# Patient Record
Sex: Male | Born: 1975 | Hispanic: Yes | Marital: Single | Smoking: Current every day smoker
Health system: Southern US, Community
[De-identification: ages and names within clinical notes are randomized; demographics above are authoritative.]

## PROBLEM LIST (undated history)

## (undated) HISTORY — PX: TONSILLECTOMY: SUR1361

---

## 2018-04-30 ENCOUNTER — Encounter (HOSPITAL_COMMUNITY): Payer: Self-pay | Admitting: Emergency Medicine

## 2018-04-30 ENCOUNTER — Other Ambulatory Visit: Payer: Self-pay

## 2018-04-30 ENCOUNTER — Emergency Department (HOSPITAL_COMMUNITY)
Admission: EM | Admit: 2018-04-30 | Discharge: 2018-04-30 | Disposition: A | Discharge status: Home / Self Care | Payer: Self-pay | Attending: Emergency Medicine | Admitting: Emergency Medicine

## 2018-04-30 DIAGNOSIS — R109 Unspecified abdominal pain: Secondary | ICD-10-CM | POA: Insufficient documentation

## 2018-04-30 DIAGNOSIS — R112 Nausea with vomiting, unspecified: Secondary | ICD-10-CM | POA: Insufficient documentation

## 2018-04-30 DIAGNOSIS — F17200 Nicotine dependence, unspecified, uncomplicated: Secondary | ICD-10-CM | POA: Insufficient documentation

## 2018-04-30 DIAGNOSIS — R197 Diarrhea, unspecified: Secondary | ICD-10-CM | POA: Insufficient documentation

## 2018-04-30 LAB — CBC
HCT: 47.3 % (ref 39.0–52.0)
HEMOGLOBIN: 15.8 g/dL (ref 13.0–17.0)
MCH: 27.6 pg (ref 26.0–34.0)
MCHC: 33.4 g/dL (ref 30.0–36.0)
MCV: 82.5 fL (ref 78.0–100.0)
Platelets: 390 10*3/uL (ref 150–400)
RBC: 5.73 MIL/uL (ref 4.22–5.81)
RDW: 12.9 % (ref 11.5–15.5)
WBC: 8.1 10*3/uL (ref 4.0–10.5)

## 2018-04-30 LAB — COMPREHENSIVE METABOLIC PANEL
ALBUMIN: 4.3 g/dL (ref 3.5–5.0)
ALK PHOS: 57 U/L (ref 38–126)
ALT: 26 U/L (ref 0–44)
AST: 26 U/L (ref 15–41)
Anion gap: 14 (ref 5–15)
BUN: 7 mg/dL (ref 6–20)
CALCIUM: 9.6 mg/dL (ref 8.9–10.3)
CO2: 19 mmol/L — AB (ref 22–32)
CREATININE: 1.04 mg/dL (ref 0.61–1.24)
Chloride: 103 mmol/L (ref 98–111)
GFR calc Af Amer: >60 mL/min (ref >60)
GFR calc non Af Amer: >60 mL/min (ref >60)
GLUCOSE: 122 mg/dL — AB (ref 70–99)
Potassium: 3.7 mmol/L (ref 3.5–5.1)
SODIUM: 136 mmol/L (ref 135–145)
Total Bilirubin: 0.8 mg/dL (ref 0.3–1.2)
Total Protein: 7.9 g/dL (ref 6.5–8.1)

## 2018-04-30 LAB — URINALYSIS, ROUTINE W REFLEX MICROSCOPIC
Bacteria, UA: NONE SEEN
Bilirubin Urine: NEGATIVE
GLUCOSE, UA: NEGATIVE mg/dL
Hgb urine dipstick: NEGATIVE
Ketones, ur: 20 mg/dL — AB
Leukocytes, UA: NEGATIVE
Nitrite: NEGATIVE
PROTEIN: 100 mg/dL — AB
Specific Gravity, Urine: 1.015 (ref 1.005–1.030)
pH: 9 — ABNORMAL HIGH (ref 5.0–8.0)

## 2018-04-30 LAB — LIPASE, BLOOD: Lipase: 34 U/L (ref 11–51)

## 2018-04-30 MED ORDER — ONDANSETRON 4 MG PO TBDP: 4.0000 mg | ORAL_TABLET | Freq: Three times a day (TID) | ORAL | 0 refills | Status: AC | PRN

## 2018-04-30 MED ORDER — LOPERAMIDE HCL 2 MG PO CAPS: 2.0000 mg | ORAL_CAPSULE | Freq: Four times a day (QID) | ORAL | 0 refills | Status: DC | PRN

## 2018-04-30 MED ORDER — SODIUM CHLORIDE 0.9 % IV BOLUS
1000.0000 mL | Freq: Once | INTRAVENOUS | Status: AC
  Administered 2018-04-30: 1000 mL via INTRAVENOUS

## 2018-04-30 MED ORDER — LOPERAMIDE HCL 2 MG PO CAPS
4.0000 mg | ORAL_CAPSULE | Freq: Once | ORAL | Status: AC
  Administered 2018-04-30: 4 mg via ORAL
  Filled 2018-04-30: qty 2

## 2018-04-30 MED ORDER — PROMETHAZINE HCL 25 MG/ML IJ SOLN
12.5000 mg | Freq: Once | INTRAMUSCULAR | Status: AC
  Administered 2018-04-30: 12.5 mg via INTRAVENOUS
  Filled 2018-04-30: qty 1

## 2018-04-30 MED ORDER — DICYCLOMINE HCL 10 MG PO CAPS
10.0000 mg | ORAL_CAPSULE | Freq: Once | ORAL | Status: AC
  Administered 2018-04-30: 10 mg via ORAL
  Filled 2018-04-30: qty 1

## 2018-04-30 MED ORDER — ONDANSETRON HCL 4 MG/2ML IJ SOLN
4.0000 mg | Freq: Once | INTRAMUSCULAR | Status: AC
  Administered 2018-04-30: 4 mg via INTRAVENOUS
  Filled 2018-04-30: qty 2

## 2018-04-30 NOTE — Discharge Instructions (Addendum)
Make sure that you are staying hydrated.  Start with clear liquids and advance diet to small bland meals.

## 2018-04-30 NOTE — ED Notes (Signed)
Pt was given ginger ale for PO/fluid challenge---- tolerated well.

## 2018-04-30 NOTE — ED Triage Notes (Addendum)
Pt here via ems with c/o abdominal pain. Pt states many coworkers have been sick with GI symptoms this week. Pt recently moved here [place]. Pt reports many episodes of emesis and diarrhea. Most information gathered from EMS as pt is not participating in assessment.

## 2018-04-30 NOTE — ED Provider Notes (Signed)
[facility]-EMERGENCY DEPT Provider Note   CSN: [id number] Arrival date & time: 04/30/18  0047     History   Chief Complaint Chief Complaint  Patient presents with  . Abdominal Pain    HPI Ivan Diaz. is a 42 y.o. male.  HPI  This is a 42 year old male who presents with nausea, vomiting, abdominal pain, diarrhea.  Patient reports 1 to 2-day history of diffuse abdominal cramping.  Pain is nonradiating.  He rates his pain now 10 out of 10.  He has associated nonbilious, nonbloody emesis.  He reports multiple episodes of diarrhea.  He has noted some streaking of blood in his diarrhea but relates this to his known hemorrhoids.  He has not taken anything for his symptoms.  He reports multiple coworkers have reported similar symptoms.  He denies fevers.  Denies chest pain, shortness of breath.  History reviewed. No pertinent past medical history.  There are no active problems to display for this patient.   History reviewed. No pertinent surgical history.      Home Medications    Prior to Admission medications   Medication Sig Start Date End Date Taking? Authorizing Provider  loperamide (IMODIUM) 2 MG capsule Take 1 capsule (2 mg total) by mouth 4 (four) times daily as needed for diarrhea or loose stools. 04/30/18   Eliazer Hemphill, Mayer Masker, MD  ondansetron (ZOFRAN ODT) 4 MG disintegrating tablet Take 1 tablet (4 mg total) by mouth every 8 (eight) hours as needed for nausea or vomiting. 04/30/18   Junie Avilla, Mayer Masker, MD    Family History No family history on file.  Social History Social History   Tobacco Use  . Smoking status: Current Every Day Smoker  Substance Use Topics  . Alcohol use: Not Currently  . Drug use: Not Currently     Allergies   Patient has no known allergies.   Review of Systems Review of Systems  Constitutional: Negative for fever.  Respiratory: Negative for shortness of breath.   Cardiovascular: Negative for chest pain.    Gastrointestinal: Positive for abdominal pain, diarrhea, nausea and vomiting.  Genitourinary: Negative for dysuria.  All other systems reviewed and are negative.    Physical Exam Updated Vital Signs BP (!) 110/96   Pulse 68   Temp 98.3 F (36.8 C) (Oral)   Resp 18   SpO2 97%   Physical Exam  Constitutional: He is oriented to person, place, and time. He appears well-developed and well-nourished.  Uncomfortable appearing and moaning but nontoxic  HENT:  Head: Normocephalic and atraumatic.  Eyes: Pupils are equal, round, and reactive to light.  Neck: Neck supple.  Cardiovascular: Normal rate, regular rhythm and normal heart sounds.  No murmur heard. Pulmonary/Chest: Effort normal and breath sounds normal. No respiratory distress. He has no wheezes.  Abdominal: Soft. Bowel sounds are normal. There is generalized tenderness. There is no rebound and no guarding.  Musculoskeletal: He exhibits no edema.  Lymphadenopathy:    He has no cervical adenopathy.  Neurological: He is alert and oriented to person, place, and time.  Skin: Skin is warm and dry.  Psychiatric: He has a normal mood and affect.  Nursing note and vitals reviewed.    ED Treatments / Results  Labs (all labs ordered are listed, but only abnormal results are displayed) Labs Reviewed  COMPREHENSIVE METABOLIC PANEL - Abnormal; Notable for the following components:      Result Value   CO2 19 (*)    Glucose, Bld 122 (*)  All other components within normal limits  URINALYSIS, ROUTINE W REFLEX MICROSCOPIC - Abnormal; Notable for the following components:   pH 9.0 (*)    Ketones, ur 20 (*)    Protein, ur 100 (*)    All other components within normal limits  LIPASE, BLOOD  CBC    EKG None  Radiology No results found.  Procedures Procedures (including critical care time)  Medications Ordered in ED Medications  sodium chloride 0.9 % bolus 1,000 mL (0 mLs Intravenous Stopped 04/30/18 0249)  ondansetron  (ZOFRAN) injection 4 mg (4 mg Intravenous Given 04/30/18 0143)  dicyclomine (BENTYL) capsule 10 mg (10 mg Oral Given 04/30/18 0145)  loperamide (IMODIUM) capsule 4 mg (4 mg Oral Given 04/30/18 0145)  sodium chloride 0.9 % bolus 1,000 mL (0 mLs Intravenous Stopped 04/30/18 0420)  promethazine (PHENERGAN) injection 12.5 mg (12.5 mg Intravenous Given 04/30/18 0320)     Initial Impression / Assessment and Plan / ED Course  I have reviewed the triage vital signs and the nursing notes.  Pertinent labs & imaging results that were available during my care of the patient were reviewed by me and considered in my medical decision making (see chart for details).     Patient presents with nausea, vomiting, diarrhea, crampy abdominal pain.  He is overall nontoxic-appearing.  Vital signs are reassuring.  He has some mild diffuse tenderness but no signs of peritonitis or focal tenderness on exam.  Patient was given fluids, Zofran, Bentyl.  Lab work-up including lipase and LFTs are normal.  He does have 20 ketones in the urine suggestive of mild dehydration.  On multiple rechecks, he is tolerating fluids.  He does report some persistent crampy pain but no persistent vomiting or diarrhea.  At this time doubt acute emergent process.  Given nonfocal exam, would doubt appendicitis or gastroenteritis, or bowel obstruction at this time.  Will discharge home with supportive measures.  Recommend hydration and slow advancement of diet.  After history, exam, and medical workup I feel the patient has been appropriately medically screened and is safe for discharge home. Pertinent diagnoses were discussed with the patient. Patient was given return precautions.   Final Clinical Impressions(s) / ED Diagnoses   Final diagnoses:  Nausea vomiting and diarrhea    ED Discharge Orders         Ordered    loperamide (IMODIUM) 2 MG capsule  4 times daily PRN     04/30/18 0425    ondansetron (ZOFRAN ODT) 4 MG disintegrating tablet   Every 8 hours PRN     04/30/18 0425           Shon Baton, MD 04/30/18 [phone]

## 2018-05-01 ENCOUNTER — Encounter (HOSPITAL_COMMUNITY): Payer: Self-pay | Admitting: Emergency Medicine

## 2018-05-01 ENCOUNTER — Emergency Department (HOSPITAL_COMMUNITY)
Admission: EM | Admit: 2018-05-01 | Discharge: 2018-05-01 | Disposition: A | Discharge status: Home / Self Care | Payer: Self-pay | Attending: Emergency Medicine | Admitting: Emergency Medicine

## 2018-05-01 ENCOUNTER — Other Ambulatory Visit: Payer: Self-pay

## 2018-05-01 ENCOUNTER — Emergency Department (HOSPITAL_COMMUNITY): Payer: Self-pay

## 2018-05-01 DIAGNOSIS — R112 Nausea with vomiting, unspecified: Secondary | ICD-10-CM | POA: Insufficient documentation

## 2018-05-01 DIAGNOSIS — R197 Diarrhea, unspecified: Secondary | ICD-10-CM | POA: Insufficient documentation

## 2018-05-01 DIAGNOSIS — F17200 Nicotine dependence, unspecified, uncomplicated: Secondary | ICD-10-CM | POA: Insufficient documentation

## 2018-05-01 DIAGNOSIS — F12188 Cannabis abuse with other cannabis-induced disorder: Secondary | ICD-10-CM | POA: Insufficient documentation

## 2018-05-01 DIAGNOSIS — R509 Fever, unspecified: Secondary | ICD-10-CM | POA: Insufficient documentation

## 2018-05-01 LAB — COMPREHENSIVE METABOLIC PANEL
ALBUMIN: 4.7 g/dL (ref 3.5–5.0)
ALT: 24 U/L (ref 0–44)
ANION GAP: 11 (ref 5–15)
AST: 22 U/L (ref 15–41)
Alkaline Phosphatase: 61 U/L (ref 38–126)
BUN: 8 mg/dL (ref 6–20)
CHLORIDE: 104 mmol/L (ref 98–111)
CO2: 24 mmol/L (ref 22–32)
Calcium: 9.8 mg/dL (ref 8.9–10.3)
Creatinine, Ser: 1.16 mg/dL (ref 0.61–1.24)
GFR calc Af Amer: >60 mL/min (ref >60)
GFR calc non Af Amer: >60 mL/min (ref >60)
GLUCOSE: 110 mg/dL — AB (ref 70–99)
POTASSIUM: 4 mmol/L (ref 3.5–5.1)
SODIUM: 139 mmol/L (ref 135–145)
TOTAL PROTEIN: 8.7 g/dL — AB (ref 6.5–8.1)
Total Bilirubin: 0.8 mg/dL (ref 0.3–1.2)

## 2018-05-01 LAB — URINALYSIS, ROUTINE W REFLEX MICROSCOPIC
BILIRUBIN URINE: NEGATIVE
Glucose, UA: NEGATIVE mg/dL
Hgb urine dipstick: NEGATIVE
Ketones, ur: 20 mg/dL — AB
Leukocytes, UA: NEGATIVE
NITRITE: NEGATIVE
PH: 9 — AB (ref 5.0–8.0)
Protein, ur: NEGATIVE mg/dL
SPECIFIC GRAVITY, URINE: 1.012 (ref 1.005–1.030)

## 2018-05-01 LAB — CBC
HEMATOCRIT: 47.9 % (ref 39.0–52.0)
HEMOGLOBIN: 16 g/dL (ref 13.0–17.0)
MCH: 27.8 pg (ref 26.0–34.0)
MCHC: 33.4 g/dL (ref 30.0–36.0)
MCV: 83.2 fL (ref 78.0–100.0)
Platelets: 405 10*3/uL — ABNORMAL HIGH (ref 150–400)
RBC: 5.76 MIL/uL (ref 4.22–5.81)
RDW: 13 % (ref 11.5–15.5)
WBC: 8.2 10*3/uL (ref 4.0–10.5)

## 2018-05-01 LAB — RAPID URINE DRUG SCREEN, HOSP PERFORMED
Amphetamines: NOT DETECTED
BARBITURATES: NOT DETECTED
Benzodiazepines: NOT DETECTED
COCAINE: NOT DETECTED
Opiates: NOT DETECTED
TETRAHYDROCANNABINOL: POSITIVE — AB

## 2018-05-01 LAB — LIPASE, BLOOD: LIPASE: 29 U/L (ref 11–51)

## 2018-05-01 MED ORDER — IOPAMIDOL (ISOVUE-300) INJECTION 61%
100.0000 mL | Freq: Once | INTRAVENOUS | Status: AC | PRN
  Administered 2018-05-01: 100 mL via INTRAVENOUS

## 2018-05-01 MED ORDER — GI COCKTAIL ~~LOC~~
30.0000 mL | Freq: Once | ORAL | Status: AC
  Administered 2018-05-01: 30 mL via ORAL
  Filled 2018-05-01: qty 30

## 2018-05-01 MED ORDER — HALOPERIDOL LACTATE 5 MG/ML IJ SOLN
5.0000 mg | Freq: Once | INTRAMUSCULAR | Status: AC
  Administered 2018-05-01: 5 mg via INTRAVENOUS
  Filled 2018-05-01: qty 1

## 2018-05-01 MED ORDER — IOPAMIDOL (ISOVUE-300) INJECTION 61%
INTRAVENOUS | Status: AC
  Filled 2018-05-01: qty 100

## 2018-05-01 MED ORDER — OMEPRAZOLE 20 MG PO CPDR: 20.0000 mg | DELAYED_RELEASE_CAPSULE | Freq: Every day | ORAL | 0 refills | Status: AC

## 2018-05-01 MED ORDER — DICYCLOMINE HCL 10 MG/ML IM SOLN
20.0000 mg | Freq: Once | INTRAMUSCULAR | Status: AC
  Administered 2018-05-01: 20 mg via INTRAMUSCULAR
  Filled 2018-05-01: qty 2

## 2018-05-01 MED ORDER — SODIUM CHLORIDE 0.9 % IJ SOLN
INTRAMUSCULAR | Status: AC
  Administered 2018-05-01: 05:00:00
  Filled 2018-05-01: qty 50

## 2018-05-01 NOTE — ED Triage Notes (Signed)
Patient complaining of abdominal pain. He states he is having fever and vomiting. Patient state he has been sick for over a week.

## 2018-05-01 NOTE — ED Notes (Signed)
Urine specimen requested 

## 2018-05-01 NOTE — ED Provider Notes (Signed)
[facility]-EMERGENCY DEPT Provider Note   CSN: [id number] Arrival date & time: 05/01/18  0211     History   Chief Complaint Chief Complaint  Patient presents with  . Fever  . Emesis  . Abdominal Pain    HPI Ivan Francis. is a 42 y.o. male.  The history is provided by the patient.  Abdominal Pain   This is a new problem. The current episode started more than 1 week ago. The problem occurs constantly. The problem has been rapidly worsening. The pain is associated with an unknown factor. The pain is located in the generalized abdominal region. The quality of the pain is cramping. The pain is severe. Associated symptoms include diarrhea, nausea and vomiting. Pertinent negatives include fever and dysuria. Nothing aggravates the symptoms. Nothing relieves the symptoms. Past workup does not include surgery. His past medical history does not include PUD, ulcerative colitis or Crohn's disease.  Seen last night for same no diarrhea today but pain and vomiting returned post eating fries at work.    History reviewed. No pertinent past medical history.  There are no active problems to display for this patient.   History reviewed. No pertinent surgical history.      Home Medications    Prior to Admission medications   Medication Sig Start Date End Date Taking? Authorizing Provider  loperamide (IMODIUM) 2 MG capsule Take 1 capsule (2 mg total) by mouth 4 (four) times daily as needed for diarrhea or loose stools. 04/30/18   Horton, Mayer Masker, MD  ondansetron (ZOFRAN ODT) 4 MG disintegrating tablet Take 1 tablet (4 mg total) by mouth every 8 (eight) hours as needed for nausea or vomiting. 04/30/18   Horton, Mayer Masker, MD    Family History History reviewed. No pertinent family history.  Social History Social History   Tobacco Use  . Smoking status: Current Every Day Smoker  . Smokeless tobacco: Never Used  Substance Use Topics  . Alcohol use: Not Currently    . Drug use: Not Currently     Allergies   Patient has no known allergies.   Review of Systems Review of Systems  Constitutional: Negative for fever and unexpected weight change.  Respiratory: Negative for shortness of breath.   Cardiovascular: Negative for chest pain, palpitations and leg swelling.  Gastrointestinal: Positive for abdominal pain, diarrhea, nausea and vomiting.  Genitourinary: Negative for dysuria, enuresis and flank pain.  All other systems reviewed and are negative.    Physical Exam Updated Vital Signs BP 118/89 (BP Location: Left Arm)   Pulse 92   Temp 98.5 F (36.9 C) (Oral)   Resp 20   Ht 5\' 8"  (1.727 m)   Wt 86.2 kg   SpO2 100%   BMI 28.89 kg/m   Physical Exam  Constitutional: He is oriented to person, place, and time. He appears well-developed and well-nourished. No distress.  HENT:  Head: Normocephalic and atraumatic.  Right Ear: External ear normal.  Left Ear: External ear normal.  Nose: Nose normal.  Mouth/Throat: Oropharynx is clear and moist. No oropharyngeal exudate.  Eyes: Pupils are equal, round, and reactive to light. EOM are normal.  Neck: Normal range of motion. Neck supple.  Cardiovascular: Normal rate, regular rhythm and intact distal pulses.  Pulmonary/Chest: Effort normal and breath sounds normal.  Abdominal: Soft. Bowel sounds are normal. He exhibits no mass. There is no tenderness. There is no rebound and no guarding.  Musculoskeletal: Normal range of motion.  Neurological: He is  alert and oriented to person, place, and time. He displays normal reflexes.  Skin: Skin is warm and dry. Capillary refill takes less than 2 seconds.     ED Treatments / Results  Labs (all labs ordered are listed, but only abnormal results are displayed) Results for orders placed or performed during the hospital encounter of 05/01/18  Lipase, blood  Result Value Ref Range   Lipase 29 11 - 51 U/L  Comprehensive metabolic panel  Result Value Ref  Range   Sodium 139 135 - 145 mmol/L   Potassium 4.0 3.5 - 5.1 mmol/L   Chloride 104 98 - 111 mmol/L   CO2 24 22 - 32 mmol/L   Glucose, Bld 110 (H) 70 - 99 mg/dL   BUN 8 6 - 20 mg/dL   Creatinine, Ser 0.96 0.61 - 1.24 mg/dL   Calcium 9.8 8.9 - 04.5 mg/dL   Total Protein 8.7 (H) 6.5 - 8.1 g/dL   Albumin 4.7 3.5 - 5.0 g/dL   AST 22 15 - 41 U/L   ALT 24 0 - 44 U/L   Alkaline Phosphatase 61 38 - 126 U/L   Total Bilirubin 0.8 0.3 - 1.2 mg/dL   GFR calc non Af Amer >60 >60 mL/min   GFR calc Af Amer >60 >60 mL/min   Anion gap 11 5 - 15  CBC  Result Value Ref Range   WBC 8.2 4.0 - 10.5 K/uL   RBC 5.76 4.22 - 5.81 MIL/uL   Hemoglobin 16.0 13.0 - 17.0 g/dL   HCT 40.9 81.1 - 91.4 %   MCV 83.2 78.0 - 100.0 fL   MCH 27.8 26.0 - 34.0 pg   MCHC 33.4 30.0 - 36.0 g/dL   RDW 78.2 95.6 - 21.3 %   Platelets 405 (H) 150 - 400 K/uL  Urinalysis, Routine w reflex microscopic  Result Value Ref Range   Color, Urine YELLOW YELLOW   APPearance CLEAR CLEAR   Specific Gravity, Urine 1.012 1.005 - 1.030   pH 9.0 (H) 5.0 - 8.0   Glucose, UA NEGATIVE NEGATIVE mg/dL   Hgb urine dipstick NEGATIVE NEGATIVE   Bilirubin Urine NEGATIVE NEGATIVE   Ketones, ur 20 (A) NEGATIVE mg/dL   Protein, ur NEGATIVE NEGATIVE mg/dL   Nitrite NEGATIVE NEGATIVE   Leukocytes, UA NEGATIVE NEGATIVE   Ct Abdomen Pelvis W Contrast  Result Date: 05/01/2018 CLINICAL DATA:  42 y/o M; abdominal pain, fever, nausea, vomiting, for over 1 week. EXAM: CT ABDOMEN AND PELVIS WITH CONTRAST TECHNIQUE: Multidetector CT imaging of the abdomen and pelvis was performed using the standard protocol following bolus administration of intravenous contrast. CONTRAST:  100 cc Isovue-300 COMPARISON:  None. FINDINGS: Lower chest: No acute abnormality. Hepatobiliary: No focal liver abnormality is seen. No gallstones, gallbladder wall thickening, or biliary dilatation. Pancreas: Unremarkable. No pancreatic ductal dilatation or surrounding inflammatory  changes. Spleen: Normal in size without focal abnormality. Adrenals/Urinary Tract: Adrenal glands are unremarkable. No focal kidney lesion. Punctate right kidney upper pole nonobstructing stone. 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 No hydronephrosis or ureter stone. Normal bladder. Stomach/Bowel: Stomach is within normal limits. Appendix appears normal. No evidence of bowel wall thickening, distention, or inflammatory changes. Vascular/Lymphatic: No significant vascular findings are present. No enlarged abdominal or pelvic lymph nodes. Reproductive: Prostate is unremarkable. Other: Small paraumbilical hernia containing fat. Musculoskeletal: No fracture is seen. IMPRESSION: 1. No acute process identified as explanation for abdominal pain. 2. Punctate right kidney upper pole nonobstructing stone. 3. Small paraumbilical  hernia containing fat. Electronically Signed   By: Mitzi Hansen M.D.   On: 05/01/2018 06:09    Procedures Procedures (including critical care time)  Medications Ordered in ED Medications  iopamidol (ISOVUE-300) 61 % injection (has no administration in time range)  gi cocktail (Maalox,Lidocaine,Donnatal) (has no administration in time range)  dicyclomine (BENTYL) injection 20 mg (has no administration in time range)  sodium chloride 0.9 % injection (  Given 05/01/18 0518)  haloperidol lactate (HALDOL) injection 5 mg (5 mg Intravenous Given 05/01/18 0518)  iopamidol (ISOVUE-300) 61 % injection 100 mL (100 mLs Intravenous Contrast Given 05/01/18 0548)      Final Clinical Impressions(s) / ED Diagnoses    Return for weakness, numbness, changes in vision or speech, fevers >100.4 unrelieved by medication, shortness of breath, intractable vomiting, or diarrhea, abdominal pain, Inability to tolerate liquids or food, cough, altered mental status or any concerns. No signs of systemic illness or infection. The patient is nontoxic-appearing on exam and vital  signs are within normal limits.    I have reviewed the triage vital signs and the nursing notes. Pertinent labs &imaging results that were available during my care of the patient were reviewed by me and considered in my medical decision making (see chart for details).  After history, exam, and medical workup I feel the patient has been appropriately medically screened and is safe for discharge home. Pertinent diagnoses were discussed with the patient. Patient was given return precautions.   Albino Bufford, MD 05/01/18 [phone]

## 2018-08-05 ENCOUNTER — Emergency Department (HOSPITAL_COMMUNITY)
Admission: EM | Admit: 2018-08-05 | Discharge: 2018-08-05 | Disposition: A | Discharge status: Home / Self Care | Payer: Self-pay | Attending: Emergency Medicine | Admitting: Emergency Medicine

## 2018-08-05 ENCOUNTER — Encounter (HOSPITAL_COMMUNITY): Payer: Self-pay | Admitting: *Deleted

## 2018-08-05 DIAGNOSIS — M791 Myalgia, unspecified site: Secondary | ICD-10-CM | POA: Insufficient documentation

## 2018-08-05 DIAGNOSIS — J111 Influenza due to unidentified influenza virus with other respiratory manifestations: Secondary | ICD-10-CM

## 2018-08-05 DIAGNOSIS — R0981 Nasal congestion: Secondary | ICD-10-CM | POA: Insufficient documentation

## 2018-08-05 DIAGNOSIS — J3489 Other specified disorders of nose and nasal sinuses: Secondary | ICD-10-CM | POA: Insufficient documentation

## 2018-08-05 DIAGNOSIS — J029 Acute pharyngitis, unspecified: Secondary | ICD-10-CM | POA: Insufficient documentation

## 2018-08-05 DIAGNOSIS — R69 Illness, unspecified: Secondary | ICD-10-CM

## 2018-08-05 DIAGNOSIS — F17210 Nicotine dependence, cigarettes, uncomplicated: Secondary | ICD-10-CM | POA: Insufficient documentation

## 2018-08-05 MED ORDER — KETOROLAC TROMETHAMINE 30 MG/ML IJ SOLN
30.0000 mg | Freq: Once | INTRAMUSCULAR | Status: AC
  Administered 2018-08-05: 30 mg via INTRAMUSCULAR
  Filled 2018-08-05: qty 1

## 2018-08-05 NOTE — ED Triage Notes (Signed)
Pt complains of generalized body aches, chills, fever, headache, nasal congestion x 2 days.

## 2018-08-05 NOTE — ED Provider Notes (Signed)
[facility]-EMERGENCY DEPT Provider Note   CSN: [id number] Arrival date & time: 08/05/18  1333     History   Chief Complaint Chief Complaint  Patient presents with  . Generalized Body Aches  . Chills  . Headache    HPI Ardath Seele. is a 43 y.o. male without significant PMHx, presenting to the ED with complaint of flu like symptoms for 3 days.  Patient states symptoms began with congestion and a sore throat.  He states the next day he had a mild dry cough.  He has had significant body aches with rhinorrhea, headache since that time.  Subjective fever.  Took a dose of Motrin yesterday.  Has also used Mucinex and cold medicine without relief.  He had a friend with similar symptoms.  No flu vaccine this season.  No difficulty breathing or swallowing,  no abdominal symptoms.  No other complaints.  The history is provided by the patient.    History reviewed. No pertinent past medical history.  There are no active problems to display for this patient.   History reviewed. No pertinent surgical history.      Home Medications    Prior to Admission medications   Medication Sig Start Date End Date Taking? Authorizing Provider  omeprazole (PRILOSEC) 20 MG capsule Take 1 capsule (20 mg total) by mouth daily. 05/01/18   Palumbo, April, MD  ondansetron (ZOFRAN ODT) 4 MG disintegrating tablet Take 1 tablet (4 mg total) by mouth every 8 (eight) hours as needed for nausea or vomiting. 04/30/18   Horton, Mayer Masker, MD    Family History No family history on file.  Social History Social History   Tobacco Use  . Smoking status: Current Every Day Smoker  . Smokeless tobacco: Never Used  Substance Use Topics  . Alcohol use: Not Currently  . Drug use: Not Currently     Allergies   Patient has no known allergies.   Review of Systems Review of Systems  HENT: Positive for congestion, rhinorrhea and sore throat. Negative for trouble swallowing and voice change.    Respiratory: Positive for cough. Negative for shortness of breath.   Musculoskeletal: Positive for myalgias (Generalized).     Physical Exam Updated Vital Signs BP (!) 128/94 (BP Location: Left Arm)   Pulse 85   Temp 98.8 F (37.1 C) (Oral)   Resp 18   SpO2 96%   Physical Exam Vitals signs and nursing note reviewed.  Constitutional:      Appearance: He is well-developed. He is not ill-appearing.  HENT:     Head: Normocephalic and atraumatic.     Right Ear: There is impacted cerumen.     Left Ear: There is impacted cerumen.     Mouth/Throat:     Mouth: Mucous membranes are moist.     Pharynx: Oropharynx is clear.  Eyes:     Conjunctiva/sclera: Conjunctivae normal.  Neck:     Musculoskeletal: Normal range of motion and neck supple.  Cardiovascular:     Rate and Rhythm: Normal rate and regular rhythm.  Pulmonary:     Effort: Pulmonary effort is normal.     Breath sounds: Normal breath sounds.  Lymphadenopathy:     Cervical: No cervical adenopathy.  Neurological:     Mental Status: He is alert.  Psychiatric:        Mood and Affect: Mood normal.        Behavior: Behavior normal.      ED Treatments / Results  Labs (all labs ordered are listed, but only abnormal results are displayed) Labs Reviewed - No data to display  EKG None  Radiology No results found.  Procedures Procedures (including critical care time)  Medications Ordered in ED Medications  ketorolac (TORADOL) 30 MG/ML injection 30 mg (30 mg Intramuscular Given 08/05/18 1509)     Initial Impression / Assessment and Plan / ED Course  I have reviewed the triage vital signs and the nursing notes.  Pertinent labs & imaging results that were available during my care of the patient were reviewed by me and considered in my medical decision making (see chart for details).     Patients symptoms are consistent with URI, likely viral etiology. Consider influenza. Afebrile, tolerating secretions.  Lungs  clear to auscultation bilaterally.  Discussed that antibiotics are not indicated for viral infections. Pt will be discharged with symptomatic treatment.  Verbalizes understanding and is agreeable with plan. Pt is hemodynamically stable & in NAD prior to dc.  Discussed results, findings, treatment and follow up. Patient advised of return precautions. Patient verbalized understanding and agreed with plan.  Final Clinical Impressions(s) / ED Diagnoses   Final diagnoses:  Influenza-like illness    ED Discharge Orders    None       Renee Beale, [place] N, PA-C 08/05/18 1521    Gwyneth Sprout, MD 08/05/18 1601

## 2018-08-05 NOTE — Discharge Instructions (Signed)
Please read instructions below.  You can alternate tylenol and ibuprofen every 4 hours for sore throat, body aches or fever.  Drink plenty of water.  Use saline nasal spray for congestion. Follow up with your primary care provider as needed.  Return to the ER for inability to swallow liquids, difficulty breathing, or new or worsening symptoms.

## 2018-09-20 ENCOUNTER — Emergency Department (HOSPITAL_COMMUNITY)
Admission: EM | Admit: 2018-09-20 | Discharge: 2018-09-20 | Disposition: A | Discharge status: Home / Self Care | Payer: Self-pay | Attending: Emergency Medicine | Admitting: Emergency Medicine

## 2018-09-20 DIAGNOSIS — Z202 Contact with and (suspected) exposure to infections with a predominantly sexual mode of transmission: Secondary | ICD-10-CM | POA: Insufficient documentation

## 2018-09-20 DIAGNOSIS — F17200 Nicotine dependence, unspecified, uncomplicated: Secondary | ICD-10-CM | POA: Insufficient documentation

## 2018-09-20 DIAGNOSIS — R300 Dysuria: Secondary | ICD-10-CM | POA: Insufficient documentation

## 2018-09-20 LAB — URINALYSIS, ROUTINE W REFLEX MICROSCOPIC
BACTERIA UA: NONE SEEN
Bilirubin Urine: NEGATIVE
Glucose, UA: NEGATIVE mg/dL
KETONES UR: NEGATIVE mg/dL
Leukocytes,Ua: NEGATIVE
Nitrite: NEGATIVE
PH: 6 (ref 5.0–8.0)
PROTEIN: NEGATIVE mg/dL
SPECIFIC GRAVITY, URINE: 1.004 — AB (ref 1.005–1.030)

## 2018-09-20 MED ORDER — LIDOCAINE HCL 2 % IJ SOLN
INTRAMUSCULAR | Status: AC
  Administered 2018-09-20: 400 mg
  Filled 2018-09-20: qty 20

## 2018-09-20 MED ORDER — CEFTRIAXONE SODIUM 250 MG IJ SOLR
250.0000 mg | Freq: Once | INTRAMUSCULAR | Status: AC
  Administered 2018-09-20: 250 mg via INTRAMUSCULAR
  Filled 2018-09-20: qty 250

## 2018-09-20 MED ORDER — AZITHROMYCIN 250 MG PO TABS
1000.0000 mg | ORAL_TABLET | Freq: Once | ORAL | Status: AC
  Administered 2018-09-20: 1000 mg via ORAL
  Filled 2018-09-20: qty 4

## 2018-09-20 NOTE — ED Triage Notes (Signed)
Pt had intercourse a week + ago and condom broke, now has itching and some discomfort with urination. Denies discharge, urine clear.

## 2018-09-20 NOTE — Discharge Instructions (Addendum)
As discussed, today's evaluation has been generally reassuring, although there are several laboratory studies which are still being conducted. You will be made aware of abnormal results.  Return here for concerning changes in your condition.

## 2018-09-20 NOTE — ED Notes (Signed)
ED Provider at bedside. 

## 2018-09-20 NOTE — ED Provider Notes (Signed)
[facility]-EMERGENCY DEPT Provider Note   CSN: [id number] Arrival date & time: 09/20/18  0759    History   Chief Complaint Chief Complaint  Patient presents with  . SEXUALLY TRANSMITTED DISEASE    HPI Ivan Mosco. is a 43 y.o. male.     HPI Previously well young male presents with concern of dysuria. Patient denies history of medical issues. About 1 week ago, he noticed that while having intercourse the condom broke. He notes that since that time he has had mild dysuria, without discharge, without discoloration of his genitals, without swelling, nausea, vomiting, fever, chills or other focal complaints. Since onset symptoms have been persistent. No clear alleviating or exacerbating factors. No past medical history on file.  There are no active problems to display for this patient.   No past surgical history on file.      Home Medications    Prior to Admission medications   Medication Sig Start Date End Date Taking? Authorizing Provider  omeprazole (PRILOSEC) 20 MG capsule Take 1 capsule (20 mg total) by mouth daily. Patient not taking: Reported on 09/20/2018 05/01/18   Palumbo, April, MD  ondansetron (ZOFRAN ODT) 4 MG disintegrating tablet Take 1 tablet (4 mg total) by mouth every 8 (eight) hours as needed for nausea or vomiting. Patient not taking: Reported on 09/20/2018 04/30/18   Horton, Mayer Masker, MD    Family History No family history on file.  Social History Social History   Tobacco Use  . Smoking status: Current Every Day Smoker  . Smokeless tobacco: Never Used  Substance Use Topics  . Alcohol use: Not Currently  . Drug use: Not Currently     Allergies   Patient has no known allergies.   Review of Systems Review of Systems  Constitutional: Negative for fever.  Respiratory: Negative for shortness of breath.   Cardiovascular: Negative for chest pain.  Musculoskeletal:       Negative aside from HPI  Skin:   Negative aside from HPI  Allergic/Immunologic: Negative for immunocompromised state.  Neurological: Negative for weakness.     Physical Exam Updated Vital Signs BP 123/69   Pulse 63   Temp 97.8 F (36.6 C) (Oral)   Resp 16   SpO2 99%   Physical Exam Vitals signs and nursing note reviewed.  Constitutional:      General: He is not in acute distress.    Appearance: He is well-developed.  HENT:     Head: Normocephalic and atraumatic.  Eyes:     Conjunctiva/sclera: Conjunctivae normal.  Cardiovascular:     Rate and Rhythm: Normal rate and regular rhythm.  Pulmonary:     Effort: Pulmonary effort is normal. No respiratory distress.     Breath sounds: No stridor.  Abdominal:     General: There is no distension.  Skin:    General: Skin is warm and dry.  Neurological:     Mental Status: He is alert and oriented to person, place, and time.      ED Treatments / Results  Labs (all labs ordered are listed, but only abnormal results are displayed) Labs Reviewed  URINALYSIS, ROUTINE W REFLEX MICROSCOPIC - Abnormal; Notable for the following components:      Result Value   Color, Urine STRAW (*)    Specific Gravity, Urine 1.004 (*)    Hgb urine dipstick SMALL (*)    All other components within normal limits  GC/CHLAMYDIA PROBE AMP ([place]) NOT AT [facility]  Procedures Procedures (including critical care time)  Medications Ordered in ED Medications - No data to display   Initial Impression / Assessment and Plan / ED Course  I have reviewed the triage vital signs and the nursing notes.  Pertinent labs & imaging results that were available during my care of the patient were reviewed by me and considered in my medical decision making (see chart for details).        11:43 AM 11:43 AM On repeat exam the patient is in no distress, awake, alert, resting comfortably. Initial urinalysis unremarkable, gonorrhea, chlamydia studies pending. We discussed risks and benefits of  empiric therapy, to which the patient is amenable to receive. No evidence for bacteremia, sepsis, patient will receive empiric antibiotics for presumed STD, while confirmatory studies are pending.  Final Clinical Impressions(s) / ED Diagnoses  Dysuria   Gerhard Munch, MD 09/20/18 1143

## 2019-01-16 ENCOUNTER — Emergency Department (HOSPITAL_COMMUNITY)
Admission: EM | Admit: 2019-01-16 | Discharge: 2019-01-16 | Disposition: A | Discharge status: Home / Self Care | Payer: Self-pay | Attending: Emergency Medicine | Admitting: Emergency Medicine

## 2019-01-16 ENCOUNTER — Encounter (HOSPITAL_COMMUNITY): Payer: Self-pay

## 2019-01-16 ENCOUNTER — Other Ambulatory Visit: Payer: Self-pay

## 2019-01-16 DIAGNOSIS — K644 Residual hemorrhoidal skin tags: Secondary | ICD-10-CM | POA: Insufficient documentation

## 2019-01-16 DIAGNOSIS — F17210 Nicotine dependence, cigarettes, uncomplicated: Secondary | ICD-10-CM | POA: Insufficient documentation

## 2019-01-16 MED ORDER — POLYETHYLENE GLYCOL 3350 17 GM/SCOOP PO POWD: 1.0000 | Freq: Once | ORAL | 0 refills | Status: AC

## 2019-01-16 MED ORDER — LIDOCAINE HCL URETHRAL/MUCOSAL 2 % EX GEL
1.0000 "application " | Freq: Once | CUTANEOUS | Status: AC
  Administered 2019-01-16: 1 via TOPICAL
  Filled 2019-01-16: qty 20

## 2019-01-16 MED ORDER — DOCUSATE SODIUM 100 MG PO CAPS: 100.0000 mg | ORAL_CAPSULE | Freq: Two times a day (BID) | ORAL | 0 refills | Status: AC

## 2019-01-16 MED ORDER — BENZOCAINE 20 % RE OINT: TOPICAL_OINTMENT | RECTAL | 0 refills | Status: AC | PRN

## 2019-01-16 MED ORDER — NITROGLYCERIN 0.4 % RE OINT: 0.5000 g | TOPICAL_OINTMENT | Freq: Two times a day (BID) | RECTAL | 0 refills | Status: AC | PRN

## 2019-01-16 MED ORDER — KETOROLAC TROMETHAMINE 30 MG/ML IJ SOLN
30.0000 mg | Freq: Once | INTRAMUSCULAR | Status: AC
  Administered 2019-01-16: 17:00:00 30 mg via INTRAMUSCULAR
  Filled 2019-01-16: qty 1

## 2019-01-16 MED ORDER — NITROGLYCERIN 2 % TD OINT
0.5000 [in_us] | TOPICAL_OINTMENT | Freq: Once | TRANSDERMAL | Status: AC
  Administered 2019-01-16: 17:00:00 0.5 [in_us] via TOPICAL
  Filled 2019-01-16: qty 1

## 2019-01-16 NOTE — ED Provider Notes (Signed)
[facility] EMERGENCY DEPARTMENT Provider Note   CSN: [id number] Arrival date & time: 01/16/19  1548    History   Chief Complaint Chief Complaint  Patient presents with  . Hemorrhoids    HPI Ivan Diaz. is a 43 y.o. male presenting for evaluation of acute onset, progressively worsening rectal pain for the last 2 days.  He reports sharp pain that worsens with attempts to ambulate and with bowel movements.  He has tried hydrocortisone rectal cream at home with minimal temporary relief of his symptoms.  He notes bright red blood per rectum when attempting to have a bowel movement.  He reports that he has had some constipation as a result of his hemorrhoids.  Has not been taking anything else for his symptoms.  Denies abdominal pain, nausea, vomiting, fevers, chest pain, or shortness of breath.  Urinary symptoms.     The history is provided by the patient.    History reviewed. No pertinent past medical history.  There are no active problems to display for this patient.   Past Surgical History:  Procedure Laterality Date  . TONSILLECTOMY          Home Medications    Prior to Admission medications   Medication Sig Start Date End Date Taking? Authorizing Provider  benzocaine (AMERICAINE) 20 % rectal ointment Place rectally every 4 (four) hours as needed for pain. 01/16/19   Nayden Czajka A, PA-C  docusate sodium (COLACE) 100 MG capsule Take 1 capsule (100 mg total) by mouth every 12 (twelve) hours. 01/16/19   Duchess Armendarez A, PA-C  Nitroglycerin 0.4 % OINT Place 0.5 g rectally 2 (two) times daily as needed (rectal pain). 01/16/19   Sashay Felling A, PA-C  omeprazole (PRILOSEC) 20 MG capsule Take 1 capsule (20 mg total) by mouth daily. Patient not taking: Reported on 09/20/2018 05/01/18   Palumbo, April, MD  ondansetron (ZOFRAN ODT) 4 MG disintegrating tablet Take 1 tablet (4 mg total) by mouth every 8 (eight) hours as needed for nausea or vomiting. Patient not  taking: Reported on 09/20/2018 04/30/18   Horton, Barbette Hair, MD  polyethylene glycol powder (GLYCOLAX/MIRALAX) 17 GM/SCOOP powder Take 255 g by mouth once for 1 dose. 01/16/19 01/16/19  Renita Papa, PA-C    Family History No family history on file.  Social History Social History   Tobacco Use  . Smoking status: Current Every Day Smoker  . Smokeless tobacco: Never Used  Substance Use Topics  . Alcohol use: Not Currently  . Drug use: Not Currently     Allergies   Patient has no known allergies.   Review of Systems Review of Systems  Constitutional: Negative for chills and fever.  Respiratory: Negative for shortness of breath.   Cardiovascular: Negative for chest pain.  Gastrointestinal: Positive for blood in stool, constipation and rectal pain. Negative for abdominal pain, nausea and vomiting.     Physical Exam Updated Vital Signs BP 121/86   Pulse 64   Temp 98 F (36.7 C) (Oral)   Resp 18   SpO2 98%   Physical Exam Vitals signs and nursing note reviewed.  Constitutional:      General: He is not in acute distress.    Appearance: He is well-developed.  HENT:     Head: Normocephalic and atraumatic.  Eyes:     General:        Right eye: No discharge.        Left eye: No discharge.  Conjunctiva/sclera: Conjunctivae normal.  Neck:     Vascular: No JVD.     Trachea: No tracheal deviation.  Cardiovascular:     Rate and Rhythm: Normal rate and regular rhythm.  Pulmonary:     Effort: Pulmonary effort is normal.     Breath sounds: Normal breath sounds.  Abdominal:     General: Abdomen is flat. Bowel sounds are normal. There is no distension.     Palpations: Abdomen is soft.     Tenderness: There is no abdominal tenderness. There is no guarding or rebound.  Genitourinary:    Rectum: External hemorrhoid present.     Comments: Examination performed in the presence of a chaperone.  Patient with 5 thrombosed nonbleeding hemorrhoids of varying sizes, up to 4 cm.  No  active bleeding.  No anal fissures or tears.  No scrotal swelling or tenderness.  Lamination somewhat limited due to pain. Skin:    Findings: No erythema.  Neurological:     Mental Status: He is alert.  Psychiatric:        Behavior: Behavior normal.      ED Treatments / Results  Labs (all labs ordered are listed, but only abnormal results are displayed) Labs Reviewed - No data to display  EKG None  Radiology No results found.  Procedures Procedures (including critical care time)  Medications Ordered in ED Medications  ketorolac (TORADOL) 30 MG/ML injection 30 mg (30 mg Intramuscular Given 01/16/19 1635)  nitroGLYCERIN (NITROGLYN) 2 % ointment 0.5 inch (0.5 inches Topical Given 01/16/19 1726)  lidocaine (XYLOCAINE) 2 % jelly 1 application (1 application Topical Given 01/16/19 1726)     Initial Impression / Assessment and Plan / ED Course  I have reviewed the triage vital signs and the nursing notes.  Pertinent labs & imaging results that were available during my care of the patient were reviewed by me and considered in my medical decision making (see chart for details).        Patient with several large external hemorrhoids on examination.  No concern for Fournier's gangrene, UTI, proctitis or prostatitis, orchitis or epididymitis.  Examination of the abdomen is benign.  No concern for perirectal abscess.  He was given Toradol and a mixture of topical nitroglycerin and lidocaine gel.  Given the number of hemorrhoids and the degree to which they are thrombosed, I feel he would best benefit from general surgery evaluation and intervention on an outpatient basis.  We discussed management of his symptoms with stool softener, fiber, topical medications, and sits baths.  Discussed strict ED return precautions. Patient verbalized understanding of and agreement with plan and is safe for discharge home at this time.  Gust patient's care with Dr. Silverio LayYao who agrees with assessment and plan at  this time.  Final Clinical Impressions(s) / ED Diagnoses   Final diagnoses:  External hemorrhoids    ED Discharge Orders         Ordered    benzocaine (AMERICAINE) 20 % rectal ointment  Every 4 hours PRN     01/16/19 1658    Nitroglycerin 0.4 % OINT  2 times daily PRN     01/16/19 1658    docusate sodium (COLACE) 100 MG capsule  Every 12 hours     01/16/19 1658    polyethylene glycol powder (GLYCOLAX/MIRALAX) 17 GM/SCOOP powder   Once     01/16/19 1658           Legrand Lasser, El BrazilMina A, PA-C 01/16/19 1807    Chaney MallingYao, David  Hsienta, MD 01/16/19 1839

## 2019-01-16 NOTE — Discharge Instructions (Addendum)
You can alternate 600 mg of ibuprofen and [phone] mg of Tylenol every 3 hours as needed for pain. Do not exceed 4000 mg of Tylenol daily.   Start taking stool softener and fiber supplements to help soften your stools and make them more regular.  Drink plenty of fluids.  Apply benzocaine ointment up to 6 times per day.  This will numb the area up and provide pain relief.  You can also apply the nitroglycerin ointment twice daily which will help make the hemorrhoids less inflamed.  I have also attached instructions on how to take a sitz bath.  Follow-up with the general surgeon for reevaluation.  There are multiple nonsurgical options for treatment of hemorrhoids as well as some surgical options.  They will help decide what will be best for you.  Return to the emergency department if any concerning signs or symptoms develop such as high fevers, abdominal pain, persistent vomiting, or uncontrolled pain.

## 2019-01-16 NOTE — ED Triage Notes (Signed)
Pt reports hemorrhoids that started 2 days ago, pt tried OTC creams at home without relief of pain, pt unable to sit in triage. Pt a.o, nad noted

## 2019-05-23 IMAGING — CT CT ABD-PELV W/ CM
2 of 4 series · 16 of 46 positions shown, 18 images · IV contrast (ISOVUE)
Comparison: None.

CLINICAL DATA: 42 y/o M; abdominal pain, fever, nausea, vomiting,
for over 1 week.

EXAM:
CT ABDOMEN AND PELVIS WITH CONTRAST
TECHNIQUE: Multidetector CT imaging of the abdomen and pelvis was performed
using the standard protocol following bolus administration of
intravenous contrast.
CONTRAST:  100 cc Bsovue-KMM

[Series 2: axial st · axial · 0.68mm/px · z∈[+1204,+1589]mm · 13 of 87 slices shown, 15 images]
[im 5/87  soft-tissue]
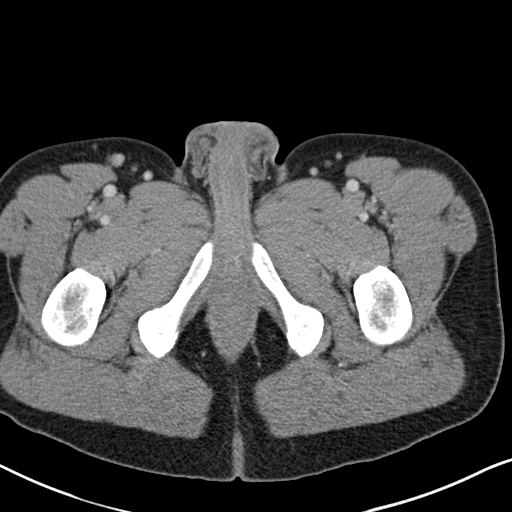
[im 5/87  bone]
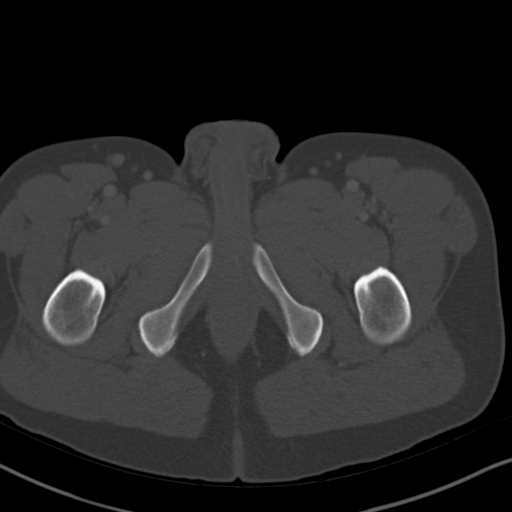
[im 13/87  soft-tissue]
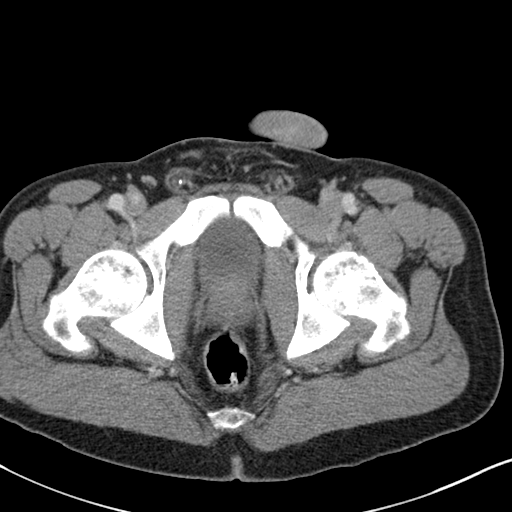
[im 17/87  soft-tissue]
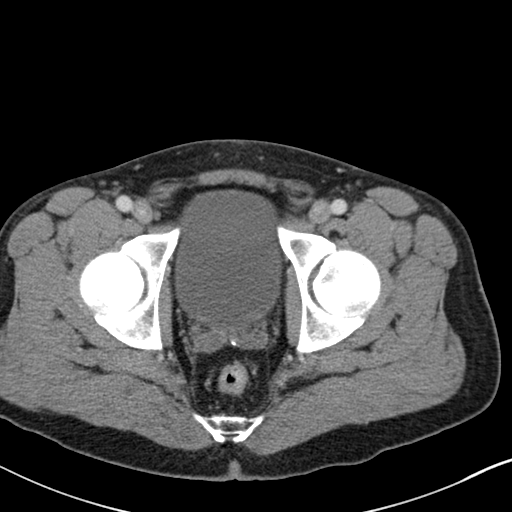
[im 25/87  soft-tissue]
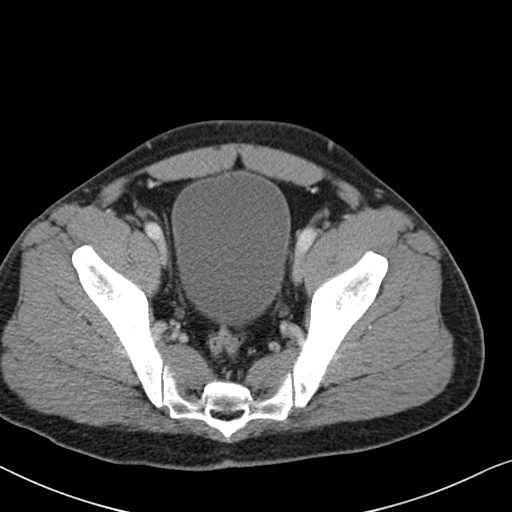
[im 29/87  soft-tissue]
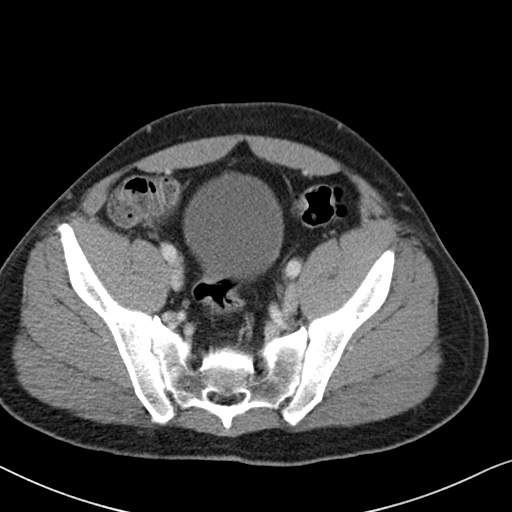
[im 37/87  soft-tissue]
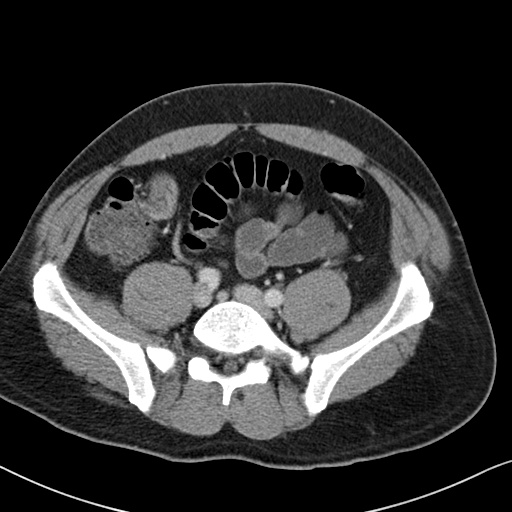
[im 46/87  soft-tissue]
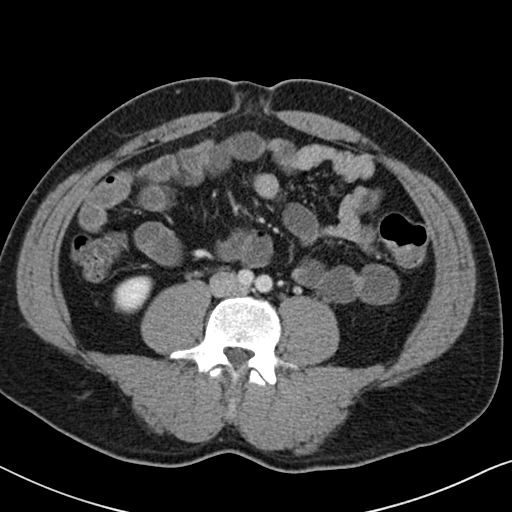
[im 50/87  soft-tissue]
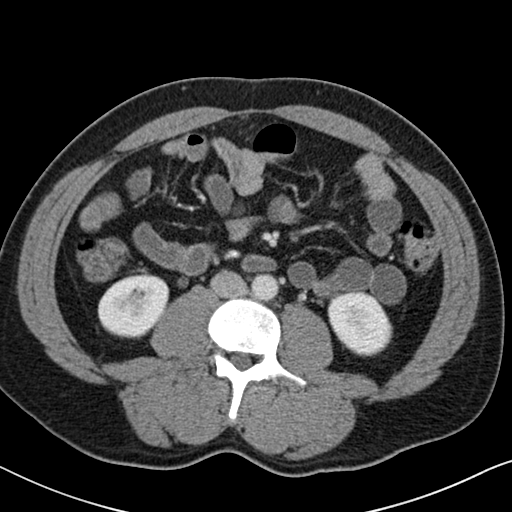
[im 58/87  soft-tissue]
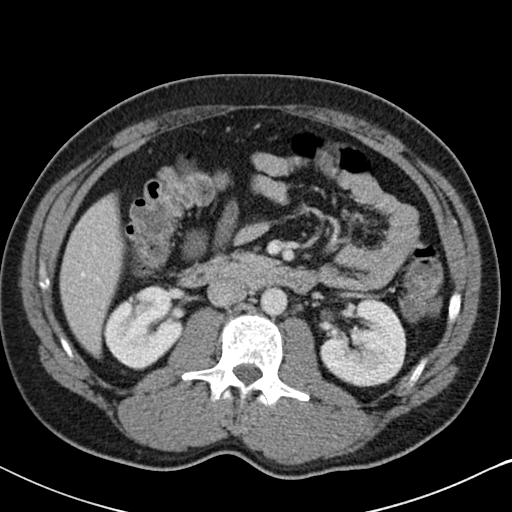
[im 58/87  bone]
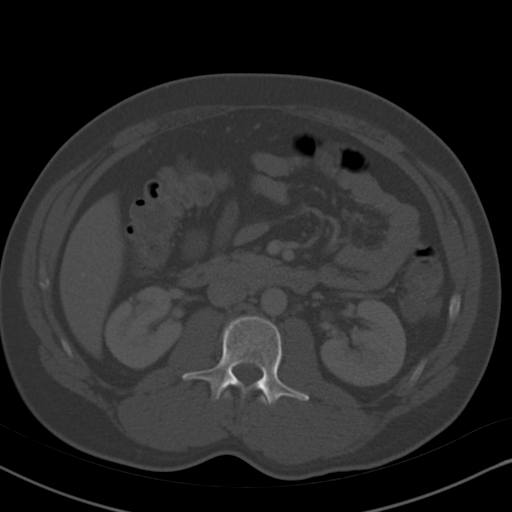
[im 62/87  soft-tissue]
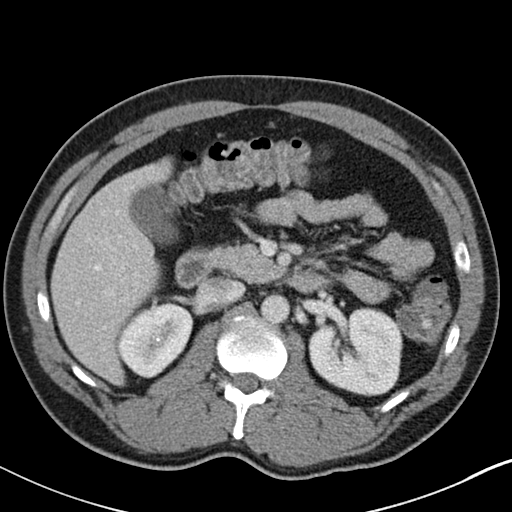
[im 70/87  soft-tissue]
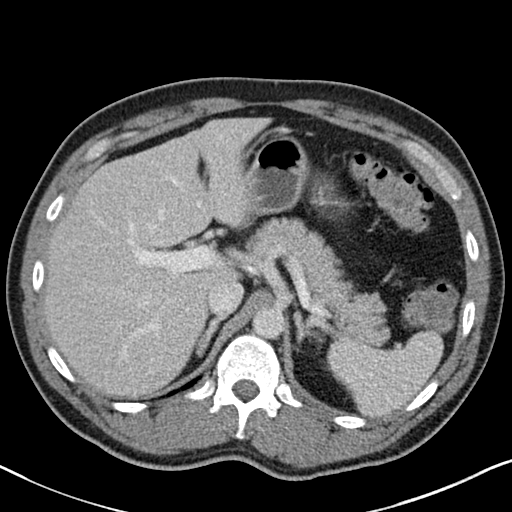
[im 74/87  soft-tissue]
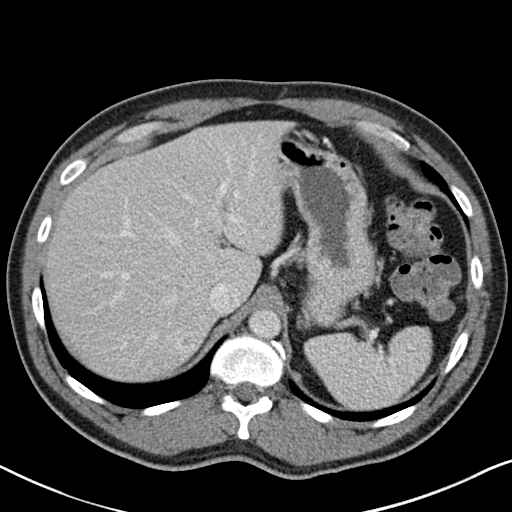
[im 82/87  soft-tissue]
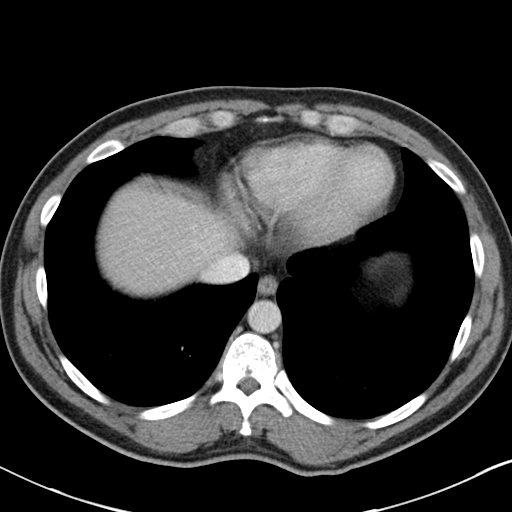

[Series 4: coronal st · coronal · 0.65mm/px · 3 of 84 slices shown]
[im 28/84  soft-tissue]
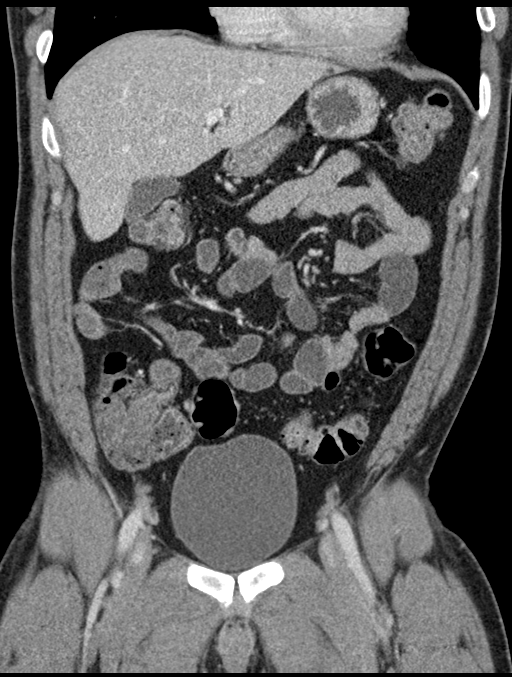
[im 37/84  soft-tissue]
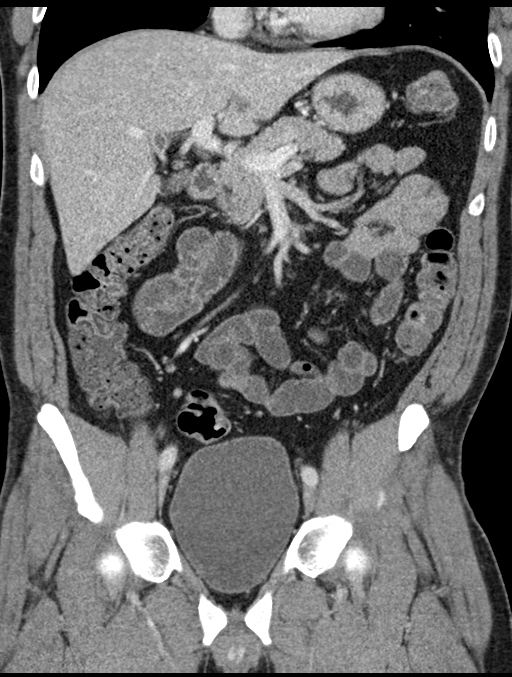
[im 47/84  soft-tissue]
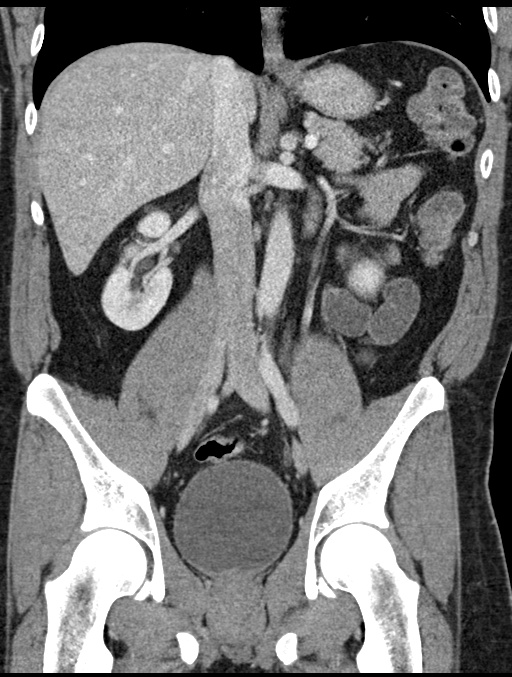

[16 of 46 positions shown; findings below may reference images not displayed]

FINDINGS: Lower chest: No acute abnormality.

Hepatobiliary: No focal liver abnormality is seen. No gallstones,
gallbladder wall thickening, or biliary dilatation.

Pancreas: Unremarkable. No pancreatic ductal dilatation or
surrounding inflammatory changes.

Spleen: Normal in size without focal abnormality.

Adrenals/Urinary Tract: Adrenal glands are unremarkable. No focal
kidney lesion. Punctate right kidney upper pole nonobstructing
stone. Incomplete rotation of the right kidney on congenital basis.
No hydronephrosis or ureter stone. Normal bladder.

Stomach/Bowel: Stomach is within normal limits. Appendix appears
normal. No evidence of bowel wall thickening, distention, or
inflammatory changes.

Vascular/Lymphatic: No significant vascular findings are present. No
enlarged abdominal or pelvic lymph nodes.

Reproductive: Prostate is unremarkable.

Other: Small paraumbilical hernia containing fat.

Musculoskeletal: No fracture is seen.
IMPRESSION: 1. No acute process identified as explanation for abdominal pain.
2. Punctate right kidney upper pole nonobstructing stone.
3. Small paraumbilical hernia containing fat.

By: Szenyei Erdesz M.D.
